# Patient Record
Sex: Female | Born: 1970 | Race: Black or African American | Hispanic: No
Health system: Southern US, Community
[De-identification: ages and names within clinical notes are randomized; demographics above are authoritative.]

## PROBLEM LIST (undated history)

## (undated) DIAGNOSIS — E7800 Pure hypercholesterolemia, unspecified: Secondary | ICD-10-CM

## (undated) HISTORY — PX: CHOLECYSTECTOMY: SHX55

---

## 2008-05-18 ENCOUNTER — Encounter: Admission: RE | Admit: 2008-05-18 | Discharge: 2008-05-18 | Payer: Self-pay | Admitting: Family Medicine

## 2008-07-08 ENCOUNTER — Ambulatory Visit (HOSPITAL_COMMUNITY): Admission: RE | Admit: 2008-07-08 | Discharge: 2008-07-08 | Payer: Self-pay | Admitting: Obstetrics

## 2008-08-26 ENCOUNTER — Ambulatory Visit (HOSPITAL_COMMUNITY): Admission: RE | Admit: 2008-08-26 | Discharge: 2008-08-26 | Payer: Self-pay | Admitting: Otolaryngology

## 2008-09-01 ENCOUNTER — Ambulatory Visit (HOSPITAL_BASED_OUTPATIENT_CLINIC_OR_DEPARTMENT_OTHER): Admission: RE | Admit: 2008-09-01 | Discharge: 2008-09-01 | Payer: Self-pay | Admitting: General Surgery

## 2008-09-01 ENCOUNTER — Encounter (INDEPENDENT_AMBULATORY_CARE_PROVIDER_SITE_OTHER): Payer: Self-pay | Admitting: General Surgery

## 2011-04-17 NOTE — Op Note (Signed)
[name], CONLEY             ACCOUNT NO.:  [id number]   MEDICAL RECORD NO.:  [id number]          PATIENT TYPE:  AMB   LOCATION:  [facility]                         FACILITY:  [facility]   PHYSICIAN:  Almond Lint, MD       DATE OF BIRTH:  September 09, 1971   DATE OF PROCEDURE:  09/01/2008  DATE OF DISCHARGE:                               OPERATIVE REPORT   PROCEDURES PERFORMED:  Excision of right submandibular mass and left  pretibial foreign body.   PREOPERATIVE DIAGNOSES:  Right submandibular mass.  Left shin foreign body.   POSTOPERATIVE DIAGNOSES:  Right submandibular mass.  Left shin foreign body.   SURGEON:  Almond Lint, M.D.   ANESTHESIA:  General and local.   FINDINGS:  Small foreign body over the left shin, approximately 3 mm in  diameter.  Right submandibular enlarged salivary gland.   SPECIMENS:  Left-shin foreign body and right submandibular mass.  Specimens to pathology.   ESTIMATED BLOOD LOSS:  Minimal.   COMPLICATIONS:  None.   DESCRIPTION OF PROCEDURE:  Monique Richards is a 40 year old female who  complained of an enlarged submandibular mass that felt like a lymph node  on physical examination.  She was identified in the holding area and  taken to the operating room, where she was placed supine on the  operating room table.  General anesthesia was induced.  Her pretibial  region was prepped and draped in sterile fashion and a small  longitudinal incision was made over the foreign body.  This was easily  excised and closed with 3-0 Vicryl and 4-0 Monocryl.  It was dressed  with a Steri-Strip.   The right submandibular mass was identified and an approximately 4 cm  incision was made, overlying the mass.  Bovie was used to dissect  through the subcutaneous tissue and then the mass was identified.  This  was elevated with an Allis.  It appeared at first to be a lymph node,  but then was apparent to be part of the salivary gland.  The hard  portion was on the outside of the  gland and this was excised partially.  The mass went too deep to fully excise it.  The portion of the mass that  was excised was simply superficial and clips were used to clip off the  portion that was  excised.  There was no bleeding at the end of the case.  This was  irrigated copiously and again inspected for bleeding and there was none.  The platysma was reapproximated with 3-0 Vicryl interrupted sutures.  The skin was reapproximated using 4-0 Monocryl.      Almond Lint, MD  Electronically Signed     FB/MEDQ  D:  09/01/2008  T:  09/01/2008  Job:  [id number]

## 2011-09-03 LAB — CBC
MCHC: 33.3
Platelets: 265
RBC: 4.53

## 2011-09-03 LAB — DIFFERENTIAL
Basophils Absolute: 0.1
Basophils Relative: 1
Monocytes Relative: 4
Neutro Abs: 6.6
Neutrophils Relative %: 70

## 2011-09-03 LAB — PREGNANCY, URINE: Preg Test, Ur: NEGATIVE

## 2012-03-17 ENCOUNTER — Ambulatory Visit: Payer: Medicaid Other | Attending: Orthopedic Surgery | Admitting: Physical Therapy

## 2012-03-17 DIAGNOSIS — M2569 Stiffness of other specified joint, not elsewhere classified: Secondary | ICD-10-CM | POA: Insufficient documentation

## 2012-03-17 DIAGNOSIS — IMO0001 Reserved for inherently not codable concepts without codable children: Secondary | ICD-10-CM | POA: Insufficient documentation

## 2012-03-17 DIAGNOSIS — M25519 Pain in unspecified shoulder: Secondary | ICD-10-CM | POA: Insufficient documentation

## 2012-03-26 ENCOUNTER — Ambulatory Visit: Payer: Medicaid Other | Admitting: Physical Therapy

## 2012-04-09 ENCOUNTER — Ambulatory Visit: Payer: Medicaid Other | Attending: Orthopedic Surgery | Admitting: Physical Therapy

## 2012-04-09 DIAGNOSIS — M25519 Pain in unspecified shoulder: Secondary | ICD-10-CM | POA: Insufficient documentation

## 2012-04-09 DIAGNOSIS — IMO0001 Reserved for inherently not codable concepts without codable children: Secondary | ICD-10-CM | POA: Insufficient documentation

## 2012-04-23 ENCOUNTER — Ambulatory Visit: Payer: Medicaid Other | Admitting: Physical Therapy

## 2012-12-25 ENCOUNTER — Ambulatory Visit
Admission: RE | Admit: 2012-12-25 | Discharge: 2012-12-25 | Disposition: A | Discharge status: Home / Self Care | Payer: Medicaid Other | Source: Ambulatory Visit | Attending: Family Medicine | Admitting: Family Medicine

## 2012-12-25 ENCOUNTER — Other Ambulatory Visit: Payer: Self-pay | Admitting: Family Medicine

## 2012-12-25 DIAGNOSIS — M545 Low back pain: Secondary | ICD-10-CM

## 2013-09-03 ENCOUNTER — Encounter (HOSPITAL_COMMUNITY): Payer: Self-pay | Admitting: *Deleted

## 2013-09-03 ENCOUNTER — Emergency Department (HOSPITAL_COMMUNITY): Payer: Medicaid Other

## 2013-09-03 ENCOUNTER — Emergency Department (HOSPITAL_COMMUNITY)
Admission: EM | Admit: 2013-09-03 | Discharge: 2013-09-04 | Disposition: A | Discharge status: Home / Self Care | Payer: Medicaid Other | Attending: Emergency Medicine | Admitting: Emergency Medicine

## 2013-09-03 DIAGNOSIS — E7800 Pure hypercholesterolemia, unspecified: Secondary | ICD-10-CM | POA: Insufficient documentation

## 2013-09-03 DIAGNOSIS — E785 Hyperlipidemia, unspecified: Secondary | ICD-10-CM | POA: Insufficient documentation

## 2013-09-03 DIAGNOSIS — Z79899 Other long term (current) drug therapy: Secondary | ICD-10-CM | POA: Insufficient documentation

## 2013-09-03 DIAGNOSIS — M79602 Pain in left arm: Secondary | ICD-10-CM

## 2013-09-03 DIAGNOSIS — Z87891 Personal history of nicotine dependence: Secondary | ICD-10-CM | POA: Insufficient documentation

## 2013-09-03 DIAGNOSIS — M79609 Pain in unspecified limb: Secondary | ICD-10-CM | POA: Insufficient documentation

## 2013-09-03 HISTORY — DX: Pure hypercholesterolemia, unspecified: E78.00

## 2013-09-03 LAB — CBC
MCV: 83.9 fL (ref 78.0–100.0)
Platelets: 251 10*3/uL (ref 150–400)
RBC: 4.65 MIL/uL (ref 3.87–5.11)
WBC: 11.8 10*3/uL — ABNORMAL HIGH (ref 4.0–10.5)

## 2013-09-03 LAB — BASIC METABOLIC PANEL
CO2: 23 mEq/L (ref 19–32)
Chloride: 105 mEq/L (ref 96–112)
GFR calc Af Amer: 74 mL/min — ABNORMAL LOW (ref >90)
Potassium: 3.8 mEq/L (ref 3.5–5.1)
Sodium: 139 mEq/L (ref 135–145)

## 2013-09-03 LAB — POCT I-STAT TROPONIN I: Troponin i, poc: 0 ng/mL (ref 0.00–0.08)

## 2013-09-03 MED ORDER — ASPIRIN 81 MG PO CHEW
324.0000 mg | CHEWABLE_TABLET | Freq: Once | ORAL | Status: AC
  Administered 2013-09-03: 324 mg via ORAL
  Filled 2013-09-03: qty 4

## 2013-09-03 NOTE — ED Provider Notes (Signed)
MSE was initiated and I personally evaluated the patient and placed orders (if any) at  9:17 PM on September 03, 2013. Patient with left arm pain beginning around 5:00 PM, radiating from her shoulder to elbow described as dull and aching, states her arm "feels funny". She had an episode similar to this one time last week, nothing in specific makes the pain come or go. No known injury or trauma. Denies chest pain or shortness of breath. States she is concerned because her father had heart attack in his 69s and she herself has a history of high cholesterol. Currently she is not in any pain, well-appearing and in no apparent distress. Mild tachycardia on exam. Blood pressure 150/97, O2 sat 99% on room air. Labs pending, CBC, BMP, troponin. Chest x-ray and EKG pending. She'll be moved to the main side of ED. The patient appears stable so that the remainder of the MSE may be completed by another provider.  Trevor Mace, PA-C 09/03/13 2119

## 2013-09-03 NOTE — ED Provider Notes (Signed)
CSN: [id number]     Arrival date & time 09/03/13  2029 History   First MD Initiated Contact with Patient 09/03/13 2030     Chief Complaint  Patient presents with  . Arm Pain   (Consider location/radiation/quality/duration/timing/severity/associated sxs/prior Treatment) HPI Comments: Patient presents today with a chief complaint of left arm pain.  Pain has been constant since 5 PM today.  She describes the pain as a dull ache.  Reports that the pain is of the left shoulder area and radiates down to the left elbow.  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  She was not doing anything exertional.  She denies any trauma or injury of the arm.  Denies any chest pain or SOB.  She reports that she had similar pain last week while driving.  She denies any pain in her neck.  She does report intermittent numbness/tingling of the left arm.  She denies any prior cardiac history.  She does have a history of Hyperlipidemia.  No history of HTN or DM.  She does not smoke.  She reports that she quit smoking 14 years ago.  Her father did have a MI when he was in his 41's.  No other family history of cardiac disease.  The history is provided by the patient.    Past Medical History  Diagnosis Date  . Hypercholesteremia    Past Surgical History  Procedure Laterality Date  . Cholecystectomy     No family history on file. History  Substance Use Topics  . Smoking status: Former Games developer  . Smokeless tobacco: Not on file  . Alcohol Use: No   OB History   Grav Para Term Preterm Abortions TAB SAB Ect Mult Living                 Review of Systems  All other systems reviewed and are negative.    Allergies  Phenergan and Penicillins  Home Medications   Current Outpatient Rx  Name  Route  Sig  Dispense  Refill  . pravastatin (PRAVACHOL) 20 MG tablet   Oral   Take 20 mg by mouth daily.          BP 154/97  Pulse 100  Temp(Src) 98 F (36.7 C) (Oral)   Resp 14  Ht 5\' 4"  (1.626 m)  Wt 185 lb (83.915 kg)  BMI 31.74 kg/m2  SpO2 99%  LMP 08/15/2013 Physical Exam  Nursing note and vitals reviewed. Constitutional: She appears well-developed and well-nourished. No distress.  HENT:  Head: Normocephalic and atraumatic.  Mouth/Throat: Oropharynx is clear and moist.  Neck: Normal range of motion. Neck supple.  Cardiovascular: Normal rate, regular rhythm, normal heart sounds and intact distal pulses.   Pulses:      Radial pulses are 2+ on the right side, and 2+ on the left side.       Dorsalis pedis pulses are 2+ on the right side, and 2+ on the left side.  Pulmonary/Chest: Effort normal and breath sounds normal.  Musculoskeletal:       Cervical back: She exhibits normal range of motion, no tenderness, no bony tenderness, no swelling, no edema and no deformity.  No lower extremity edema No tenderness to palpation of the left arm. No erythema or edema of the left arm.   Full ROM of the left arm  Neurological: She is alert.  Distal sensation of the left hand is intact  Skin: Skin is warm  and dry. She is not diaphoretic.  Psychiatric: She has a normal mood and affect.    ED Course  Procedures (including critical care time) Labs Review Labs Reviewed  CBC  BASIC METABOLIC PANEL   Imaging Review Dg Chest 2 View  09/03/2013   *RADIOLOGY REPORT*  Clinical Data: Chest pain and left arm pain  CHEST - 2 VIEW  Comparison: 05/18/2008  Findings: The heart size and vascular pattern are normal.  The lungs are clear.  No change from prior study.  IMPRESSION: Negative   Original Report Authenticated By: Esperanza Heir, M.D.   Dg Cervical Spine Complete  09/04/2013   CLINICAL DATA:  Neck pain, left arm pain. No known injury.  EXAM: CERVICAL SPINE  4+ VIEWS  COMPARISON:  None.  FINDINGS: There is no evidence of cervical spine fracture or prevertebral soft tissue swelling. Alignment is normal. No other significant bone abnormalities are identified.   IMPRESSION: Negative cervical spine radiographs.   Electronically Signed   By: Charlett Nose M.D.   On: 09/04/2013 00:08     Date: 09/03/2013  Rate: 100  Rhythm: sinus tachycardia  QRS Axis: normal  Intervals: normal  ST/T Wave abnormalities: normal  Conduction Disutrbances:none  Narrative Interpretation:   Old EKG Reviewed: none available  Patient discussed with Dr. Lynelle Doctor.  MDM  No diagnosis found. Patient presenting with left arm pain.  She denies chest pain or SOB.  No injury to the arm.  No erythema or edema of the arm.  She denies neck pain.  EKG unremarkable.  Troponin negative.  Labs unremarkable.  CXR negative.  Cervical spine xray negative.  Feel that the patient is stable for discharge.  Patient given referral to Cardiology to obtain outpatient stress test.  Strict return precautions discussed with the patient.      Pascal Lux [place], PA-C 09/04/13 0040

## 2013-09-03 NOTE — ED Provider Notes (Signed)
Medical screening examination/treatment/procedure(s) were performed by non-physician practitioner and as supervising physician I was immediately available for consultation/collaboration. Devoria Albe, MD, Armando Gang   Ward Givens, MD 09/03/13 2122

## 2013-09-03 NOTE — ED Notes (Signed)
Pt states that she began to have left arm pain this pm around 5pm; pt states that the pain goes from the shoulder to elbow area; pt describes the pain as dull and aching; pt denies chest pain or shortness of breath; pt states that she had similar pain one day last week but it only last for a little while and then stopped.

## 2013-09-07 NOTE — ED Provider Notes (Signed)
Medical screening examination/treatment/procedure(s) were performed by non-physician practitioner and as supervising physician I was immediately available for consultation/collaboration. Devoria Albe, MD, Armando Gang   Ward Givens, MD 09/07/13 [phone]

## 2014-02-26 IMAGING — CR DG CHEST 2V
2 series · 2 of 2 positions shown · non-contrast
Comparison: 05/18/2008

CLINICAL DATA: Chest pain and left arm pain

CHEST - 2 VIEW

[w chest pa]
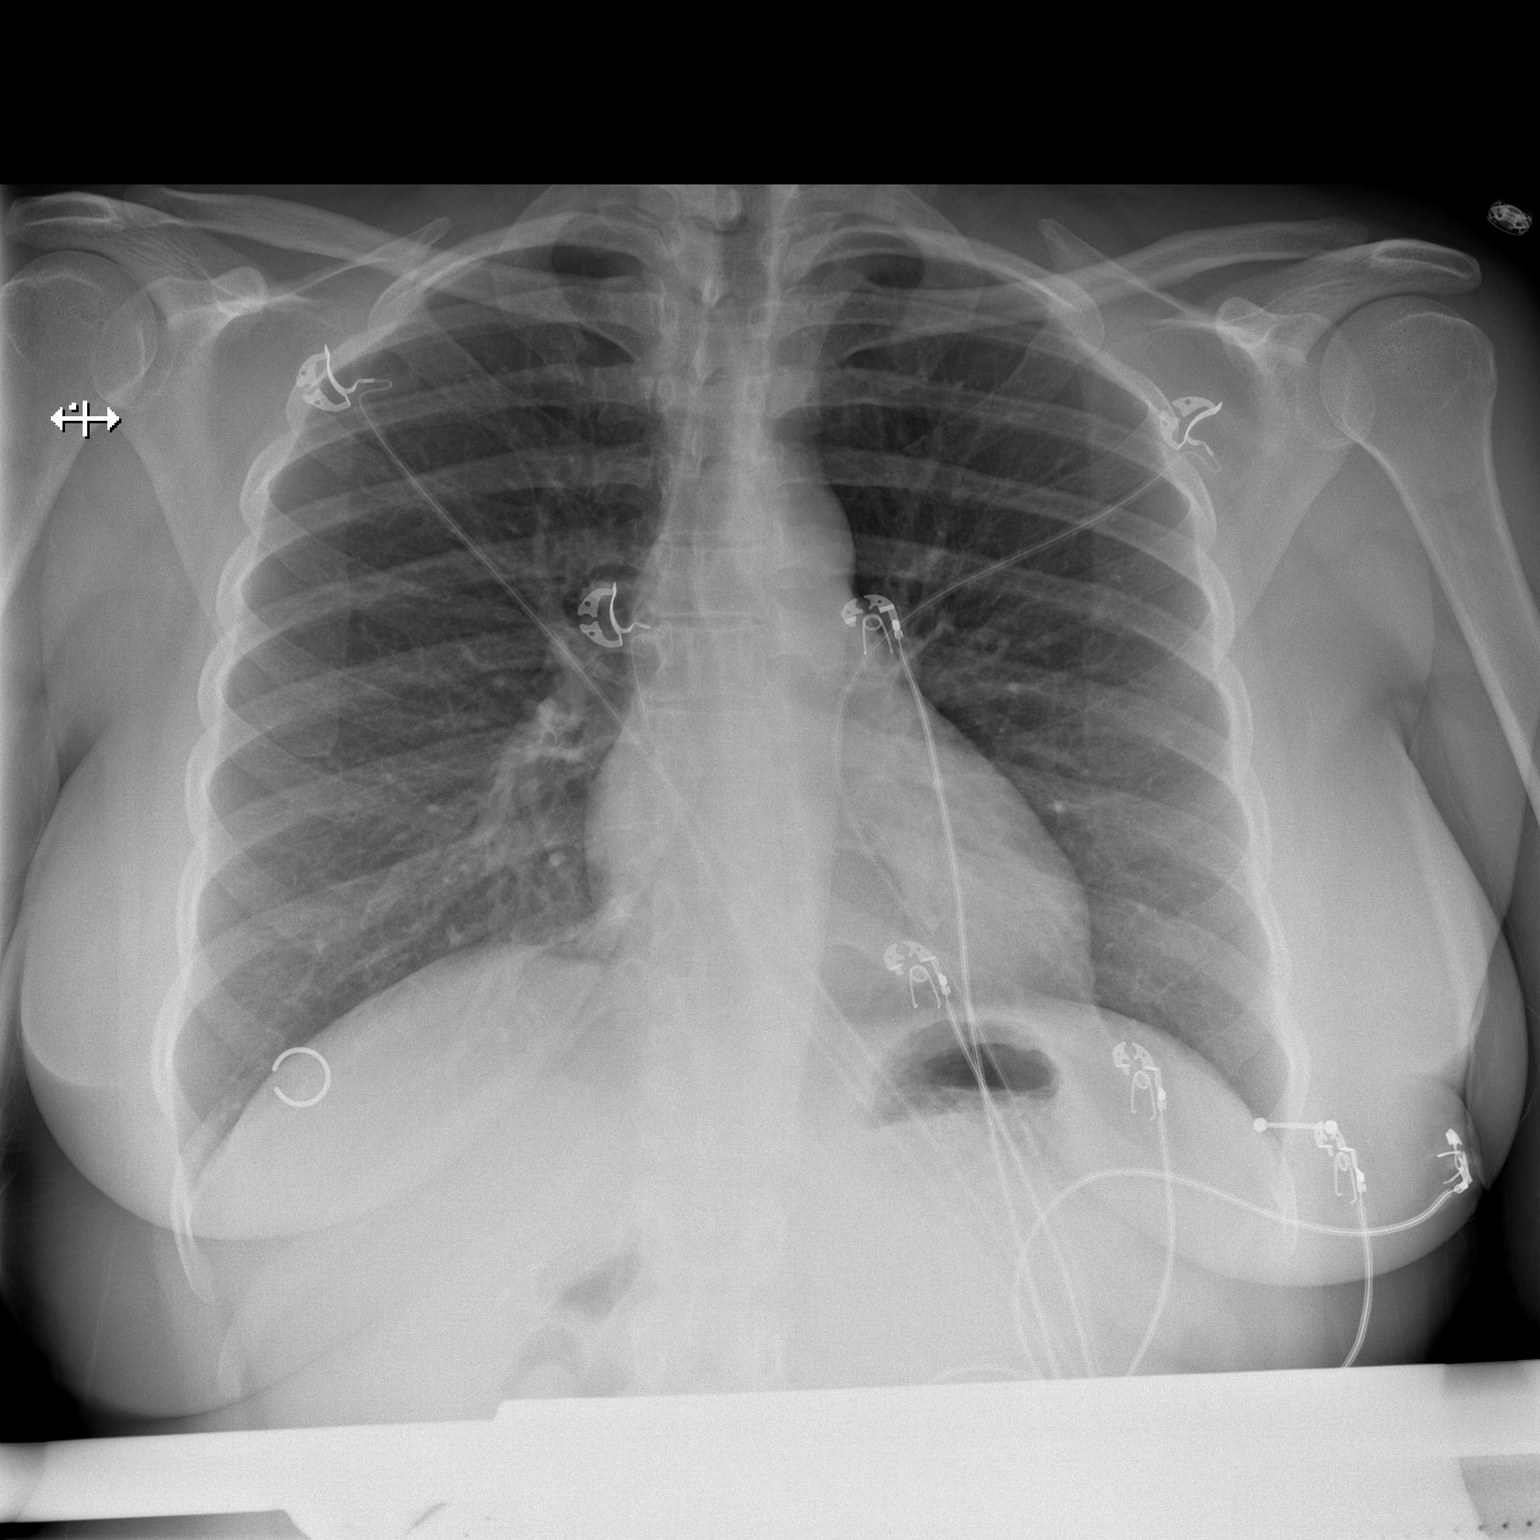

[w chest lat]
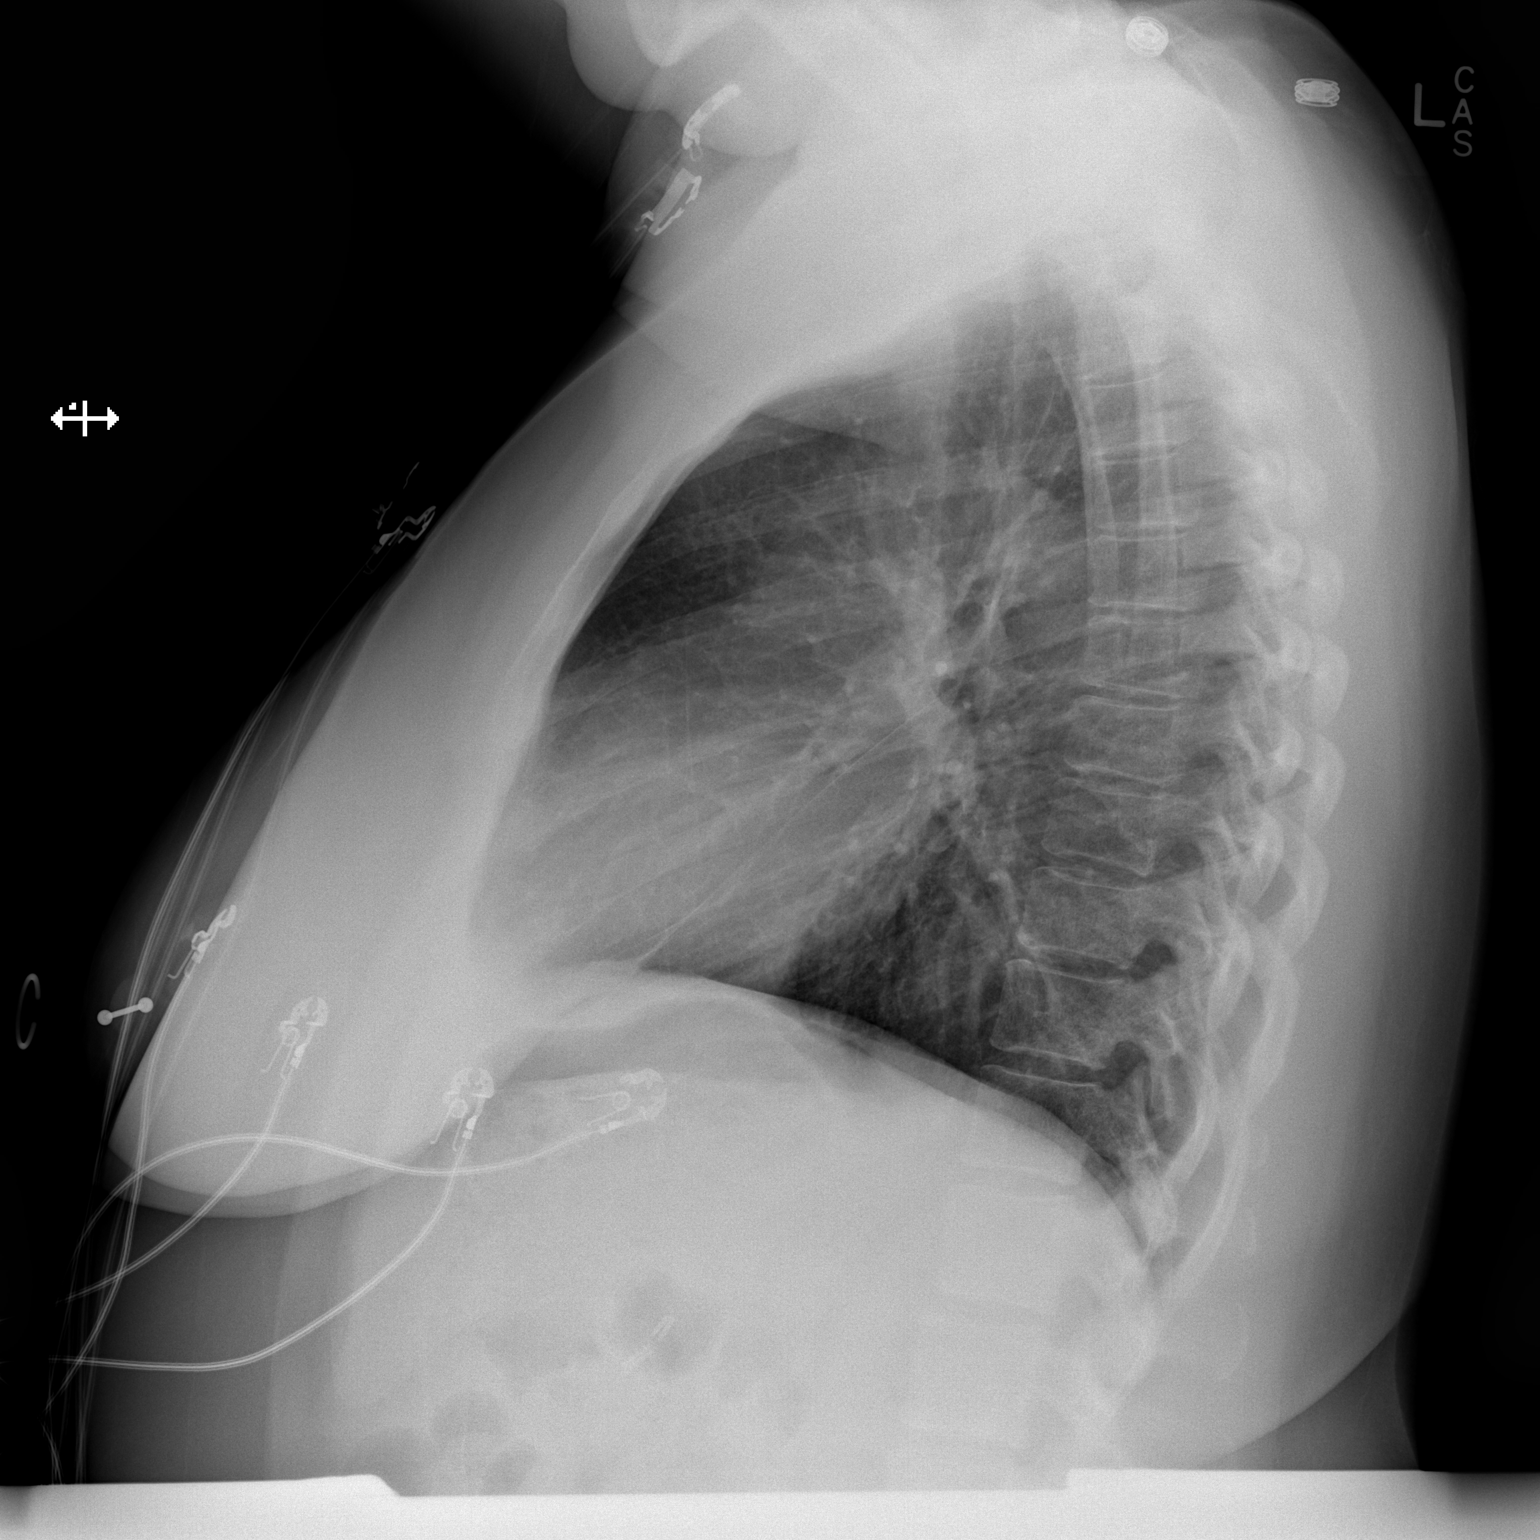

[2 of 2 positions shown; findings below may reference images not displayed]

FINDINGS: The heart size and vascular pattern are normal.  The
lungs are clear.  No change from prior study.
IMPRESSION: Negative

## 2014-06-13 ENCOUNTER — Encounter (HOSPITAL_COMMUNITY): Payer: Self-pay | Admitting: Emergency Medicine

## 2014-06-13 ENCOUNTER — Emergency Department (HOSPITAL_COMMUNITY)
Admission: EM | Admit: 2014-06-13 | Discharge: 2014-06-13 | Disposition: A | Discharge status: Home / Self Care | Payer: Medicaid Other | Source: Home / Self Care | Attending: Family Medicine | Admitting: Family Medicine

## 2014-06-13 DIAGNOSIS — L738 Other specified follicular disorders: Secondary | ICD-10-CM

## 2014-06-13 DIAGNOSIS — L739 Follicular disorder, unspecified: Secondary | ICD-10-CM

## 2014-06-13 MED ORDER — FLUCONAZOLE 150 MG PO TABS: 150.0000 mg | ORAL_TABLET | Freq: Once | ORAL | Status: DC

## 2014-06-13 MED ORDER — SULFAMETHOXAZOLE-TRIMETHOPRIM 800-160 MG PO TABS: 2.0000 | ORAL_TABLET | Freq: Two times a day (BID) | ORAL | Status: DC

## 2014-06-13 NOTE — Discharge Instructions (Signed)
Thank you for coming in today. Avoid pregnancy while taking the antibiotic.   Folliculitis  Folliculitis is redness, soreness, and swelling (inflammation) of the hair follicles. This condition can occur anywhere on the body. People with weakened immune systems, diabetes, or obesity have a greater risk of getting folliculitis. CAUSES  Bacterial infection. This is the most common cause.  Fungal infection.  Viral infection.  Contact with certain chemicals, especially oils and tars. Long-term folliculitis can result from bacteria that live in the nostrils. The bacteria may trigger multiple outbreaks of folliculitis over time. SYMPTOMS Folliculitis most commonly occurs on the scalp, thighs, legs, back, buttocks, and areas where hair is shaved frequently. An early sign of folliculitis is a small, white or yellow, pus-filled, itchy lesion (pustule). These lesions appear on a red, inflamed follicle. They are usually less than 0.2 inches (5 mm) wide. When there is an infection of the follicle that goes deeper, it becomes a boil or furuncle. A group of closely packed boils creates a larger lesion (carbuncle). Carbuncles tend to occur in hairy, sweaty areas of the body. DIAGNOSIS  Your caregiver can usually tell what is wrong by doing a physical exam. A sample may be taken from one of the lesions and tested in a lab. This can help determine what is causing your folliculitis. TREATMENT  Treatment may include:  Applying warm compresses to the affected areas.  Taking antibiotic medicines orally or applying them to the skin.  Draining the lesions if they contain a large amount of pus or fluid.  Laser hair removal for cases of long-lasting folliculitis. This helps to prevent regrowth of the hair. HOME CARE INSTRUCTIONS  Apply warm compresses to the affected areas as directed by your caregiver.  If antibiotics are prescribed, take them as directed. Finish them even if you start to feel better.  You  may take over-the-counter medicines to relieve itching.  Do not shave irritated skin.  Follow up with your caregiver as directed. SEEK IMMEDIATE MEDICAL CARE IF:   You have increasing redness, swelling, or pain in the affected area.  You have a fever. MAKE SURE YOU:  Understand these instructions.  Will watch your condition.  Will get help right away if you are not doing well or get worse. Document Released: 01/28/2002 Document Revised: 05/20/2012 Document Reviewed: 02/19/2012 [facility] Patient Information 2015 [place]Care, [place]. This information is not intended to replace advice given to you by your health care provider. Make sure you discuss any questions you have with your health care provider.

## 2014-06-13 NOTE — ED Provider Notes (Signed)
[place] is a 43 y.o. female who presents to Urgent Care today for folliculitis. Patient notes a tender area on her right buttocks present for several days. She notes that it is worsening. She suspects that she may have been bitten by a bug but cannot recall any actual bug bite. She has not tried any medications yet. No fevers or chills nausea vomiting or diarrhea.   Past Medical History  Diagnosis Date  . Hypercholesteremia    History  Substance Use Topics  . Smoking status: Former Games developermoker  . Smokeless tobacco: Not on file  . Alcohol Use: No   ROS as above Medications: No current facility-administered medications for this encounter.   Current Outpatient Prescriptions  Medication Sig Dispense Refill  . fluconazole (DIFLUCAN) 150 MG tablet Take 1 tablet (150 mg total) by mouth once.  1 tablet  1  . pravastatin (PRAVACHOL) 20 MG tablet Take 20 mg by mouth daily.      Marland Kitchen. sulfamethoxazole-trimethoprim (SEPTRA DS) 800-160 MG per tablet Take 2 tablets by mouth 2 (two) times daily.  28 tablet  0    Exam:  BP 127/83  Pulse 72  Temp(Src) 99.2 F (37.3 C) (Oral)  Resp 18  SpO2 99%  LMP 05/17/2014 Gen: Well NAD Skin: Right buttocks. Quarter size area of induration and erythema with no fluctuance. Tender to touch.  No results found for this or any previous visit (from the past 24 hour(s)). No results found.  Assessment and Plan: 43 y.o. female with folliculitis versus early abscess. Not yet fluctuant or drainable. Plan to treat with Bactrim. Followup with primary care Dr. as needed.  Discussed warning signs or symptoms. Please see discharge instructions. Patient expresses understanding.    Rodolph BongEvan S Alexio Sroka, MD 06/13/14 1246

## 2014-06-13 NOTE — ED Notes (Addendum)
C/ insect bite on buttocks which was noticed on Friday morning States area does itch Did drain clear

## 2014-06-16 ENCOUNTER — Encounter (HOSPITAL_COMMUNITY): Payer: Self-pay | Admitting: Emergency Medicine

## 2014-06-16 ENCOUNTER — Emergency Department (HOSPITAL_COMMUNITY)
Admission: EM | Admit: 2014-06-16 | Discharge: 2014-06-16 | Disposition: A | Discharge status: Home / Self Care | Payer: Medicaid Other | Attending: Emergency Medicine | Admitting: Emergency Medicine

## 2014-06-16 DIAGNOSIS — Z792 Long term (current) use of antibiotics: Secondary | ICD-10-CM | POA: Diagnosis not present

## 2014-06-16 DIAGNOSIS — E7800 Pure hypercholesterolemia, unspecified: Secondary | ICD-10-CM | POA: Insufficient documentation

## 2014-06-16 DIAGNOSIS — E785 Hyperlipidemia, unspecified: Secondary | ICD-10-CM | POA: Insufficient documentation

## 2014-06-16 DIAGNOSIS — Z880 Allergy status to penicillin: Secondary | ICD-10-CM | POA: Insufficient documentation

## 2014-06-16 DIAGNOSIS — Z79899 Other long term (current) drug therapy: Secondary | ICD-10-CM | POA: Insufficient documentation

## 2014-06-16 DIAGNOSIS — L0231 Cutaneous abscess of buttock: Secondary | ICD-10-CM | POA: Diagnosis not present

## 2014-06-16 DIAGNOSIS — L0390 Cellulitis, unspecified: Secondary | ICD-10-CM

## 2014-06-16 DIAGNOSIS — Z87891 Personal history of nicotine dependence: Secondary | ICD-10-CM | POA: Insufficient documentation

## 2014-06-16 DIAGNOSIS — L03317 Cellulitis of buttock: Principal | ICD-10-CM

## 2014-06-16 DIAGNOSIS — L0291 Cutaneous abscess, unspecified: Secondary | ICD-10-CM

## 2014-06-16 MED ORDER — CEPHALEXIN 500 MG PO CAPS: ORAL_CAPSULE | ORAL | Status: AC

## 2014-06-16 MED ORDER — OXYCODONE-ACETAMINOPHEN 5-325 MG PO TABS: 1.0000 | ORAL_TABLET | Freq: Four times a day (QID) | ORAL | Status: AC | PRN

## 2014-06-16 MED ORDER — OXYCODONE-ACETAMINOPHEN 5-325 MG PO TABS
1.0000 | ORAL_TABLET | Freq: Once | ORAL | Status: AC
  Administered 2014-06-16: 1 via ORAL
  Filled 2014-06-16: qty 1

## 2014-06-16 NOTE — ED Notes (Signed)
C/o abscess on buttocks since Thursday.  [facility] and taking abx without relief.

## 2014-06-16 NOTE — Discharge Instructions (Signed)
Keep wound clean and dry. Apply warm compresses to affected area throughout the day. Take sitz baths 4 times a day. Take antibiotic until it is finished. Take percocet as directed, as needed for pain but do not drive or operate machinery with pain medication use. Followup with Primary Care doctor in 3 days for wound recheck.  Return to emergency department for emergent changing or worsening symptoms, including worsening redness or warmth, and high fevers.   Abscess An abscess (boil or furuncle) is an infected area on or under the skin. This area is filled with yellowish-white fluid (pus) and other material (debris). HOME CARE   Only take medicines as told by your doctor.  If you were given antibiotic medicine, take it as directed. Finish the medicine even if you start to feel better.  If gauze is used, follow your doctor's directions for changing the gauze.  To avoid spreading the infection:  Keep your abscess covered with a bandage.  Wash your hands well.  Do not share personal care items, towels, or whirlpools with others.  Avoid skin contact with others.  Keep your skin and clothes clean around the abscess.  Keep all doctor visits as told. GET HELP RIGHT AWAY IF:   You have more pain, puffiness (swelling), or redness in the wound site.  You have more fluid or blood coming from the wound site.  You have muscle aches, chills, or you feel sick.  You have a fever. MAKE SURE YOU:   Understand these instructions.  Will watch your condition.  Will get help right away if you are not doing well or get worse. Document Released: 05/07/2008 Document Revised: 05/20/2012 Document Reviewed: 02/01/2012 [facility]Exit[facility] Patient Information 2015 [place]ExitCare, [place]LL[place]. This information is not intended to replace advice given to you by your health care provider. Make sure you discuss any questions you have with your health care provider.  Cellulitis Cellulitis is an infection of the skin and the  tissue under the skin. The infected area is usually red and tender. This happens most often in the arms and lower legs. HOME CARE   Take your antibiotic medicine as told. Finish the medicine even if you start to feel better.  Keep the infected arm or leg raised (elevated).  Put a warm cloth on the area up to 4 times per day.  Only take medicines as told by your doctor.  Keep all doctor visits as told. GET HELP RIGHT AWAY IF:   You have a fever.  You feel very sleepy.  You throw up (vomit) or have watery poop (diarrhea).  You feel sick and have muscle aches and pains.  You see red streaks on the skin coming from the infected area.  Your red area gets bigger or turns a dark color.  Your bone or joint under the infected area is painful after the skin heals.  Your infection comes back in the same area or different area.  You have a puffy (swollen) bump in the infected area.  You have new symptoms. MAKE SURE YOU:   Understand these instructions.  Will watch your condition.  Will get help right away if you are not doing well or get worse. Document Released: 05/07/2008 Document Revised: 05/20/2012 Document Reviewed: 02/04/2012 [facility] DeptExit[facility] Patient Information 2015 [place]ExitCare, [place]LLC. This information is not intended to replace advice given to you by your health care provider. Make sure you discuss any questions you have with your health care provider.

## 2014-06-16 NOTE — ED Provider Notes (Signed)
CSN: [id number]     Arrival date & time 06/16/14  1830 History  This chart was scribed for Monique Renshaw, PA-C working with Laray Anger, DO by Evon Slack, ED Scribe. This patient was seen in room TR05C/TR05C and the patient's care was started at 7:35 PM.     Chief Complaint  Patient presents with  . Abscess   HPI Comments: Monique Richards is a 43 y.o. Female the past medical history of hyperlipidemia, who presents to the Emergency Department complaining of gradually worsening abscess on right buttock onset 5 days. She states she first noticed the area of redness warmth and swelling on Thursday, which was the size of a dime that time. By Sunday it had become a size of a quarter but still firm, red, and warm. At that time she saw the urgent care who prescribed her Bactrim. She states the pain is throbbing and 8/10, constant, and nonradiating. She states that the area continued swelling and is now the size of her palm and feels warm to touch. She states she has some associated purulent drainage. She denies fever, chills, abd pain, nausea, vomiting, changes in bowel, rectal pain, rectal discharge. Denies any red streaking. denies Chest pain, shortness of breath, myalgias, arthralgias, rash.   Patient is a 43 y.o. female presenting with abscess. The history is provided by the patient. No language interpreter was used.  Abscess Location:  Ano-genital Ano-genital abscess location:  R buttock Size:  "the size of my palm" Abscess quality: draining, induration, painful, redness and warmth   Red streaking: no   Duration:  5 days Progression:  Worsening (increasing area of induration, but otherwise unchanged) Pain details:    Quality:  Throbbing   Severity:  Moderate   Duration:  5 days   Timing:  Constant   Progression:  Unchanged Chronicity:  New Context: not diabetes, not insect bite/sting and not skin injury   Relieved by:  Draining/squeezing and warm water soaks Worsened by:   Nothing tried Ineffective treatments:  None tried Associated symptoms: no fatigue, no fever, no headaches, no nausea and no vomiting   Risk factors: no hx of MRSA and no prior abscess      Past Medical History  Diagnosis Date  . Hypercholesteremia    Past Surgical History  Procedure Laterality Date  . Cholecystectomy     No family history on file. History  Substance Use Topics  . Smoking status: Former Games developer  . Smokeless tobacco: Not on file  . Alcohol Use: No   OB History   Grav Para Term Preterm Abortions TAB SAB Ect Mult Living                 Review of Systems  Constitutional: Negative for fever, chills and fatigue.  Respiratory: Negative for shortness of breath.   Cardiovascular: Negative for chest pain and leg swelling.  Gastrointestinal: Negative for nausea, vomiting, abdominal pain, constipation, blood in stool and rectal pain.  Genitourinary: Negative for dysuria.  Musculoskeletal: Negative for arthralgias, back pain and myalgias.  Skin: Positive for color change and wound.  Neurological: Negative for dizziness, weakness and headaches.  10 Systems reviewed and are negative for acute change except as noted in the HPI.  Allergies  Phenergan and Penicillins  Home Medications   Prior to Admission medications   Medication Sig Start Date End Date Taking? Authorizing Provider  pravastatin (PRAVACHOL) 20 MG tablet Take 20 mg by mouth daily.   Yes Historical Provider, MD  sulfamethoxazole-trimethoprim (  BACTRIM DS,SEPTRA DS) 800-160 MG per tablet Take 2 tablets by mouth 2 (two) times daily. Started medication 06-11-14 06/13/14  Yes Rodolph Bong, MD  cephALEXin (KEFLEX) 500 MG capsule 2 caps po bid x 7 days 06/16/14   Donnita Falls Camprubi-Soms, PA-C  fluconazole (DIFLUCAN) 150 MG tablet Take 150 mg by mouth once. Patient states she has this medication on the side just in case the antibiotic gives her a yeast infection 06/13/14   Rodolph Bong, MD   oxyCODONE-acetaminophen (PERCOCET) 5-325 MG per tablet Take 1-2 tablets by mouth every 6 (six) hours as needed for severe pain. 06/16/14   Gerline Ratto Strupp Camprubi-Soms, PA-C   Triage Vitals: BP 125/75  Pulse 79  Temp(Src) 98.8 F (37.1 C) (Oral)  Resp 18  Ht 5\' 4"  (1.626 m)  Wt 181 lb (82.101 kg)  BMI 31.05 kg/m2  SpO2 98%  LMP 06/16/2014  Physical Exam  Nursing note and vitals reviewed. Constitutional: She is oriented to person, place, and time. Vital signs are normal. She appears well-developed and well-nourished. No distress.  Afebrile, nontoxic, NAD  HENT:  Head: Normocephalic and atraumatic.  Mouth/Throat: Mucous membranes are normal.  Eyes: Conjunctivae and EOM are normal.  Neck: Normal range of motion. Neck supple.  Cardiovascular: Normal rate and intact distal pulses.   Pulmonary/Chest: Effort normal. No respiratory distress.  Abdominal: Normal appearance. She exhibits no distension.  Musculoskeletal: Normal range of motion.  Neurological: She is alert and oriented to person, place, and time.  Skin: Skin is warm and dry. There is erythema.     12 cm area of induration on right buttock, with no fluctuance. Area is erythematous and tender to palpation and slightly warm to touch. There is a open draining wound in the center of this indurated area. The drainage appears to be purulent. There is no red streaking.  Psychiatric: She has a normal mood and affect. Her behavior is normal.    ED Course  Procedures (including critical care time) DIAGNOSTIC STUDIES: Oxygen Saturation is 98% on RA, normal by my interpretation.    COORDINATION OF CARE: 7:43 PM-Discussed treatment plan which includes pain medication with pt at bedside and pt agreed to plan.     Labs Review Labs Reviewed - No data to display  Imaging Review No results found.   EKG Interpretation None      MDM   Final diagnoses:  Abscess and cellulitis     Monique Richards is a 43 y.o. female the  past medical history of HLD, presenting with an abscess to the right buttock for the last 5 days. States that she went to urgent care on Sunday and was given Bactrim alone. States that the pain has increased, and now is draining purulent material. Denies that the redness has spread. Denies any fevers or chills, no systemic signs or symptoms. Patient is afebrile, nontoxic, with obvious area of induration and surrounding cellulitis. She does not have any signs or symptoms of a fistulous tract, given that she is not having any trouble with her bowel functions or rectal pain, therefore I do not feel the need to have any further imaging at this time. Patient was not properly treated with outpatient antibiotic regimen, and is not having any systemic symptoms, therefore I do not consider this a failure of outpatient treatment. Pt is young and healthy with no comorbid complicating disease. The abscess is draining on its own, and there is no fluctuance so no I&D is necessary at this time.  Will add Keflex to patient's antibiotic regimen, give percocet for pain control, and have patient followup with her primary care doctor in the next 7 days, to ensure that this is improving. Patient was given strict return cautions including increasing redness, fevers, chills or any worsening symptoms. I explained the diagnosis and have given explicit precautions to return to the ER including for any other new or worsening symptoms. The patient understands and accepts the medical plan as it's been dictated and I have answered their questions. Discharge instructions concerning home care and prescriptions have been given. The patient is STABLE and is discharged to home in good condition.   I personally performed the services described in this documentation, which was scribed in my presence. The recorded information has been reviewed and is accurate.  BP 117/76  Pulse 72  Temp(Src) 98.5 F (36.9 C) (Oral)  Resp 18  Ht 5\' 4"  (1.626 m)   Wt 181 lb (82.101 kg)  BMI 31.05 kg/m2  SpO2 99%  LMP 06/16/2014   Donnita FallsMercedes Strupp Camprubi-Soms, PA-C 06/17/14 0503

## 2014-06-17 ENCOUNTER — Emergency Department (HOSPITAL_COMMUNITY)
Admission: EM | Admit: 2014-06-17 | Discharge: 2014-06-17 | Disposition: A | Discharge status: Home / Self Care | Payer: Medicaid Other | Attending: Emergency Medicine | Admitting: Emergency Medicine

## 2014-06-17 ENCOUNTER — Encounter (HOSPITAL_COMMUNITY): Payer: Self-pay | Admitting: Emergency Medicine

## 2014-06-17 DIAGNOSIS — Z87891 Personal history of nicotine dependence: Secondary | ICD-10-CM | POA: Insufficient documentation

## 2014-06-17 DIAGNOSIS — E7800 Pure hypercholesterolemia, unspecified: Secondary | ICD-10-CM | POA: Diagnosis not present

## 2014-06-17 DIAGNOSIS — L0231 Cutaneous abscess of buttock: Secondary | ICD-10-CM | POA: Insufficient documentation

## 2014-06-17 DIAGNOSIS — Z79899 Other long term (current) drug therapy: Secondary | ICD-10-CM | POA: Insufficient documentation

## 2014-06-17 DIAGNOSIS — Z880 Allergy status to penicillin: Secondary | ICD-10-CM | POA: Insufficient documentation

## 2014-06-17 DIAGNOSIS — Z792 Long term (current) use of antibiotics: Secondary | ICD-10-CM | POA: Diagnosis not present

## 2014-06-17 DIAGNOSIS — L03317 Cellulitis of buttock: Secondary | ICD-10-CM | POA: Diagnosis not present

## 2014-06-17 NOTE — ED Notes (Signed)
Declined W/C at D/C and was escorted to lobby by RN. 

## 2014-06-17 NOTE — ED Provider Notes (Signed)
CSN: [id number]     Arrival date & time 06/17/14  1446 History  This chart was scribed for non-physician practitioner Junius FinnerErin O'Malley, working with No att. providers found by Carl Bestelina Holson, ED Scribe. This patient was seen in room TR07C/TR07C and the patient's care was started at 3:55 PM.    Chief Complaint  Patient presents with  . Skin Ulcer   The history is provided by the patient. No language interpreter was used.   HPI Comments: Monique Richards is a 43 y.o. female who presents to the Emergency Department complaining of a painful abscess on her right buttock that she noticed a week ago.  Pain is constant, 10/10, aching throbbing, worse with palpation. She states that the abscess started draining Tuesday evening.  The patient states that she started taking Scepter on Sunday and Cephalexin yesterday.  She states that she has finished the Cephalexin and she did not complete the course of Scepter.  The patient states that she has had an abscess on her axillary but has not experienced an abscess this severe before.  She states that she is allergic to augmentin and phenergen.    Past Medical History  Diagnosis Date  . Hypercholesteremia    Past Surgical History  Procedure Laterality Date  . Cholecystectomy     History reviewed. No pertinent family history. History  Substance Use Topics  . Smoking status: Former Games developermoker  . Smokeless tobacco: Not on file  . Alcohol Use: No   OB History   Grav Para Term Preterm Abortions TAB SAB Ect Mult Living                 Review of Systems  Skin: Positive for wound. Negative for color change, pallor and rash.  All other systems reviewed and are negative.     Allergies  Phenergan and Penicillins  Home Medications   Prior to Admission medications   Medication Sig Start Date End Date Taking? Authorizing Provider  cephALEXin (KEFLEX) 500 MG capsule 2 caps po bid x 7 days 06/16/14  Yes Mercedes Strupp Camprubi-Soms, PA-C   oxyCODONE-acetaminophen (PERCOCET) 5-325 MG per tablet Take 1-2 tablets by mouth every 6 (six) hours as needed for severe pain. 06/16/14  Yes Mercedes Strupp Camprubi-Soms, PA-C  pravastatin (PRAVACHOL) 20 MG tablet Take 20 mg by mouth daily.   Yes Historical Provider, MD   Triage Vitals: BP 151/96  Pulse 75  Temp(Src) 98.7 F (37.1 C) (Oral)  Resp 18  SpO2 99%  LMP 06/16/2014  Physical Exam  Nursing note and vitals reviewed. Constitutional: She is oriented to person, place, and time. She appears well-developed and well-nourished.  HENT:  Head: Normocephalic and atraumatic.  Eyes: EOM are normal.  Neck: Normal range of motion.  Cardiovascular: Normal rate.   Pulmonary/Chest: Effort normal.  Musculoskeletal: Normal range of motion.  Neurological: She is alert and oriented to person, place, and time.  Skin: Skin is warm and dry.  Psychiatric: She has a normal mood and affect. Her behavior is normal.    ED Course  Procedures (including critical care time)  DIAGNOSTIC STUDIES: Oxygen Saturation is 99% on room air, normal by my interpretation.    COORDINATION OF CARE:  Discussed performing an I&D and the patient agreed to the treatment plan.  INCISION AND DRAINAGE Performed by: Junius Finner'MALLEY, Skyley Grandmaison A. Consent: Verbal consent obtained. Risks and benefits: risks, benefits and alternatives were discussed Type: abscess  Body area: right buttock  Anesthesia: local infiltration  Incision was made with a  scalpel.  Local anesthetic: lidocaine 2% epinephrine  Anesthetic total: 1.5 ml  Complexity: complex Blunt dissection to break up loculations  Drainage: bloody, purulent  Drainage amount: scant  Packing material: 1/4 in iodoform gauze  Patient tolerance: Patient tolerated the procedure well with no immediate complications.     Labs Review Labs Reviewed - No data to display  Imaging Review No results found.   EKG Interpretation None      MDM   Final  diagnoses:  Abscess of right buttock   Pt c/o abscess of right buttock. I&D performed with scant discharge. Packing placed.  Advised to f/u with PCP or urgent care for wound recheck in 2-3 days. Advised to continue taking all antibiotics as prescribed. Home care instructions provided.   Return precautions provided. Pt verbalized understanding and agreement with tx plan.   I personally performed the services described in this documentation, which was scribed in my presence. The recorded information has been reviewed and is accurate.    Junius Finner, PA-C 06/17/14 1655

## 2014-06-17 NOTE — ED Provider Notes (Signed)
Medical screening examination/treatment/procedure(s) were performed by non-physician practitioner and as supervising physician I was immediately available for consultation/collaboration.   EKG Interpretation None        Laray AngerKathleen M Aairah Negrette, DO 06/17/14 1403

## 2014-06-17 NOTE — ED Notes (Signed)
Pt reports she took a oxycodone for pain before leaving for ED. Time approx. ago.

## 2014-06-17 NOTE — ED Notes (Signed)
Pt presents again for abscess to R buttock. Pt seen here yesterday for same, was given abx and percocet; pt reports area is increasing but reports drainage; reports area tender x 1 week.

## 2014-06-17 NOTE — Discharge Instructions (Signed)
Continue to take all of your antibiotics as prescribed.  Be sure to follow up in 2-3 days for wound recheck and packing change. See below for further instructions.    Abscess Care After An abscess (also called a boil or furuncle) is an infected area that contains a collection of pus. Signs and symptoms of an abscess include pain, tenderness, redness, or hardness, or you may feel a moveable soft area under your skin. An abscess can occur anywhere in the body. The infection may spread to surrounding tissues causing cellulitis. A cut (incision) by the surgeon was made over your abscess and the pus was drained out. Gauze may have been packed into the space to provide a drain that will allow the cavity to heal from the inside outwards. The boil may be painful for 5 to 7 days. Most people with a boil do not have high fevers. Your abscess, if seen early, may not have localized, and may not have been lanced. If not, another appointment may be required for this if it does not get better on its own or with medications. HOME CARE INSTRUCTIONS   Only take over-the-counter or prescription medicines for pain, discomfort, or fever as directed by your caregiver.  When you bathe, soak and then remove gauze or iodoform packs at least daily or as directed by your caregiver. You may then wash the wound gently with mild soapy water. Repack with gauze or do as your caregiver directs. SEEK IMMEDIATE MEDICAL CARE IF:   You develop increased pain, swelling, redness, drainage, or bleeding in the wound site.  You develop signs of generalized infection including muscle aches, chills, fever, or a general ill feeling.  An oral temperature above 102 F (38.9 C) develops, not controlled by medication. See your caregiver for a recheck if you develop any of the symptoms described above. If medications (antibiotics) were prescribed, take them as directed. Document Released: 06/07/2005 Document Revised: 02/11/2012 Document  Reviewed: 02/02/2008 [facility] Patient Information 2015 [employer] [place]. This information is not intended to replace advice given to you by your health care provider. Make sure you discuss any questions you have with your health care provider.

## 2014-06-18 NOTE — ED Provider Notes (Signed)
Medical screening examination/treatment/procedure(s) were performed by non-physician practitioner and as supervising physician I was immediately available for consultation/collaboration.   EKG Interpretation None        Javione Gunawan F Soha Thorup, MD 06/18/14 0831 

## 2014-08-31 ENCOUNTER — Encounter (HOSPITAL_COMMUNITY): Payer: Self-pay | Admitting: Emergency Medicine

## 2014-08-31 ENCOUNTER — Emergency Department (HOSPITAL_COMMUNITY)
Admission: EM | Admit: 2014-08-31 | Discharge: 2014-09-02 | Disposition: E | Payer: Self-pay | Attending: Emergency Medicine | Admitting: Emergency Medicine

## 2014-08-31 DIAGNOSIS — S81809A Unspecified open wound, unspecified lower leg, initial encounter: Secondary | ICD-10-CM

## 2014-08-31 DIAGNOSIS — I469 Cardiac arrest, cause unspecified: Secondary | ICD-10-CM | POA: Insufficient documentation

## 2014-08-31 DIAGNOSIS — Y9301 Activity, walking, marching and hiking: Secondary | ICD-10-CM | POA: Insufficient documentation

## 2014-08-31 DIAGNOSIS — IMO0002 Reserved for concepts with insufficient information to code with codable children: Secondary | ICD-10-CM | POA: Insufficient documentation

## 2014-08-31 DIAGNOSIS — S0180XA Unspecified open wound of other part of head, initial encounter: Secondary | ICD-10-CM | POA: Insufficient documentation

## 2014-08-31 DIAGNOSIS — S91009A Unspecified open wound, unspecified ankle, initial encounter: Secondary | ICD-10-CM

## 2014-08-31 DIAGNOSIS — Y92410 Unspecified street and highway as the place of occurrence of the external cause: Secondary | ICD-10-CM | POA: Insufficient documentation

## 2014-08-31 DIAGNOSIS — J942 Hemothorax: Secondary | ICD-10-CM

## 2014-08-31 DIAGNOSIS — S271XXA Traumatic hemothorax, initial encounter: Secondary | ICD-10-CM | POA: Insufficient documentation

## 2014-08-31 DIAGNOSIS — S0990XA Unspecified injury of head, initial encounter: Secondary | ICD-10-CM

## 2014-08-31 DIAGNOSIS — S82302C Unspecified fracture of lower end of left tibia, initial encounter for open fracture type IIIA, IIIB, or IIIC: Secondary | ICD-10-CM

## 2014-08-31 DIAGNOSIS — S82899B Other fracture of unspecified lower leg, initial encounter for open fracture type I or II: Secondary | ICD-10-CM | POA: Insufficient documentation

## 2014-08-31 DIAGNOSIS — T148XXA Other injury of unspecified body region, initial encounter: Secondary | ICD-10-CM | POA: Insufficient documentation

## 2014-08-31 DIAGNOSIS — S81009A Unspecified open wound, unspecified knee, initial encounter: Secondary | ICD-10-CM | POA: Insufficient documentation

## 2014-08-31 LAB — PREPARE FRESH FROZEN PLASMA
Unit division: 0
Unit division: 0
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
## 2014-09-01 ENCOUNTER — Encounter (HOSPITAL_COMMUNITY): Payer: Self-pay | Admitting: Emergency Medicine

## 2014-09-01 LAB — TYPE AND SCREEN
UNIT DIVISION: 0
Unit division: 0

## 2014-09-01 NOTE — Progress Notes (Addendum)
Note moved to appropriate time column.
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
## 2014-09-01 NOTE — ED Notes (Signed)
[facility] notified of pt's death - pt possibly tissue and eyes donor - eye prep completed. Referral number: [id number]
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
## 2014-09-01 NOTE — ED Provider Notes (Signed)
I saw and evaluated the patient, reviewed the resident's note and I agree with the findings and plan.  Please see my separate note regarding my evaluation of the patient.    Vida RollerBrian D Kashana Breach, MD 09/01/14 [phone]
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
## 2014-09-02 DEATH — deceased
